# Patient Record
Sex: Male | Born: 2000 | Hispanic: Yes | Marital: Single | Smoking: Never smoker
Health system: Southern US, Community
[De-identification: ages and names within clinical notes are randomized; demographics above are authoritative.]

---
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
## 2018-12-31 ENCOUNTER — Other Ambulatory Visit: Payer: Self-pay

## 2018-12-31 ENCOUNTER — Encounter: Payer: Self-pay | Admitting: Emergency Medicine

## 2018-12-31 DIAGNOSIS — R1030 Lower abdominal pain, unspecified: Secondary | ICD-10-CM | POA: Insufficient documentation

## 2018-12-31 NOTE — ED Triage Notes (Signed)
Patient ambulatory to triage with steady gait, without difficulty or distress noted; pt reports last few days having intermittent nosebleed; also lower abd pain with no accomp symptoms

## 2019-01-01 ENCOUNTER — Emergency Department: Payer: Self-pay

## 2019-01-01 ENCOUNTER — Emergency Department
Admission: EM | Admit: 2019-01-01 | Discharge: 2019-01-01 | Disposition: A | Discharge status: Home / Self Care | Payer: Self-pay | Attending: Emergency Medicine | Admitting: Emergency Medicine

## 2019-01-01 DIAGNOSIS — R1030 Lower abdominal pain, unspecified: Secondary | ICD-10-CM

## 2019-01-01 LAB — COMPREHENSIVE METABOLIC PANEL
ALT: 10 U/L (ref 0–44)
AST: 16 U/L (ref 15–41)
Albumin: 4.6 g/dL (ref 3.5–5.0)
Alkaline Phosphatase: 60 U/L (ref 38–126)
Anion gap: 7 (ref 5–15)
BUN: 11 mg/dL (ref 6–20)
CHLORIDE: 106 mmol/L (ref 98–111)
CO2: 26 mmol/L (ref 22–32)
CREATININE: 1.05 mg/dL (ref 0.61–1.24)
Calcium: 8.9 mg/dL (ref 8.9–10.3)
GFR calc non Af Amer: >60 mL/min (ref >60)
Glucose, Bld: 93 mg/dL (ref 70–99)
Potassium: 3.7 mmol/L (ref 3.5–5.1)
Sodium: 139 mmol/L (ref 135–145)
Total Bilirubin: 0.4 mg/dL (ref 0.3–1.2)
Total Protein: 7.8 g/dL (ref 6.5–8.1)

## 2019-01-01 LAB — URINALYSIS, COMPLETE (UACMP) WITH MICROSCOPIC
BILIRUBIN URINE: NEGATIVE
Bacteria, UA: NONE SEEN
GLUCOSE, UA: NEGATIVE mg/dL
Hgb urine dipstick: NEGATIVE
KETONES UR: NEGATIVE mg/dL
Leukocytes,Ua: NEGATIVE
Nitrite: NEGATIVE
PROTEIN: NEGATIVE mg/dL
Specific Gravity, Urine: 1.018 (ref 1.005–1.030)
Squamous Epithelial / LPF: NONE SEEN (ref 0–5)
pH: 5 (ref 5.0–8.0)

## 2019-01-01 LAB — CBC WITH DIFFERENTIAL/PLATELET
ABS IMMATURE GRANULOCYTES: 0.01 10*3/uL (ref 0.00–0.07)
BASOS PCT: 0 %
Basophils Absolute: 0 10*3/uL (ref 0.0–0.1)
Eosinophils Absolute: 0 10*3/uL (ref 0.0–0.5)
Eosinophils Relative: 0 %
HCT: 39.2 % (ref 39.0–52.0)
Hemoglobin: 13.5 g/dL (ref 13.0–17.0)
IMMATURE GRANULOCYTES: 0 %
Lymphocytes Relative: 34 %
Lymphs Abs: 2.8 10*3/uL (ref 0.7–4.0)
MCH: 29.9 pg (ref 26.0–34.0)
MCHC: 34.4 g/dL (ref 30.0–36.0)
MCV: 86.7 fL (ref 80.0–100.0)
MONOS PCT: 6 %
Monocytes Absolute: 0.5 10*3/uL (ref 0.1–1.0)
NEUTROS ABS: 4.9 10*3/uL (ref 1.7–7.7)
NEUTROS PCT: 60 %
Platelets: 268 10*3/uL (ref 150–400)
RBC: 4.52 MIL/uL (ref 4.22–5.81)
RDW: 11.3 % — ABNORMAL LOW (ref 11.5–15.5)
WBC: 8.2 10*3/uL (ref 4.0–10.5)
nRBC: 0 % (ref 0.0–0.2)

## 2019-01-01 LAB — LIPASE, BLOOD: Lipase: 32 U/L (ref 11–51)

## 2019-01-01 NOTE — ED Provider Notes (Signed)
[facility] Emergency Department Provider Note    First MD Initiated Contact with Patient 01/01/19 0405     (approximate)  I have reviewed the triage vital signs and the nursing notes.   HISTORY  Chief Complaint Epistaxis and Abdominal Pain   HPI Garrett Jackson is a 18 y.o. male presents to the emergency department with bilateral groin pain which patient states has been occurring for months.  Patient denies any dysuria no hematuria.  Patient denies any nausea vomiting or diarrhea.  Patient denies any constipation.  Patient denies any fever.        History reviewed. No pertinent past medical history.  There are no active problems to display for this patient.   History reviewed. No pertinent surgical history.  Prior to Admission medications   Not on File    Allergies Patient has no known allergies.  No family history on file.  Social History Social History   Tobacco Use  . Smoking status: Never Smoker  . Smokeless tobacco: Never Used  Substance Use Topics  . Alcohol use: No  . Drug use: Not on file    Review of Systems Constitutional: No fever/chills Eyes: No visual changes. ENT: No sore throat. Cardiovascular: Denies chest pain. Respiratory: Denies shortness of breath. Gastrointestinal: Positive for bilateral groin pain..  No nausea, no vomiting.  No diarrhea.  No constipation. Genitourinary: Negative for dysuria. Musculoskeletal: Negative for neck pain.  Negative for back pain. Integumentary: Negative for rash. Neurological: Negative for headaches, focal weakness or numbness.   ____________________________________________   PHYSICAL EXAM:  VITAL SIGNS: ED Triage Vitals  Enc Vitals Group     BP 12/31/18 2336 120/61     Pulse Rate 12/31/18 2336 87     Resp 12/31/18 2336 14     Temp 12/31/18 2336 98.3 F (36.8 C)     Temp Source 12/31/18 2336 Oral     SpO2 12/31/18 2336 97 %     Weight 12/31/18 2333 56.7 kg  (125 lb)     Height 12/31/18 2333 1.778 m (5\' 10" )     Head Circumference --      Peak Flow --      Pain Score 12/31/18 2333 7     Pain Loc --      Pain Edu? --      Excl. in GC? --     Constitutional: Alert and oriented. Well appearing and in no acute distress. Eyes: Conjunctivae are normal. . Mouth/Throat: Mucous membranes are moist. Oropharynx non-erythematous. Neck: No stridor.  Cardiovascular: Normal rate, regular rhythm. Good peripheral circulation. Grossly normal heart sounds. Respiratory: Normal respiratory effort.  No retractions. Lungs CTAB. Gastrointestinal: Soft and nontender. No distention.  Musculoskeletal: No lower extremity tenderness nor edema. No gross deformities of extremities. Neurologic:  Normal speech and language. No gross focal neurologic deficits are appreciated.  Skin:  Skin is warm, dry and intact. No rash noted. Psychiatric: Mood and affect are normal. Speech and behavior are normal.  ____________________________________________   LABS (all labs ordered are listed, but only abnormal results are displayed)  Labs Reviewed  CBC WITH DIFFERENTIAL/PLATELET - Abnormal; Notable for the following components:      Result Value   RDW 11.3 (*)    All other components within normal limits  URINALYSIS, COMPLETE (UACMP) WITH MICROSCOPIC - Abnormal; Notable for the following components:   Color, Urine YELLOW (*)    APPearance CLEAR (*)    All other components within normal limits  COMPREHENSIVE METABOLIC PANEL  LIPASE, BLOOD   ____________________________________  RADIOLOGY I, [place] N , personally viewed and evaluated these images (plain radiographs) as part of my medical decision making, as well as reviewing the written report by the radiologist.  ED MD interpretation: Negative exam CT renal study.  Official radiology report(s): Ct Renal Stone Study  Result Date: 01/01/2019 CLINICAL DATA:  Thigh or groin pain.  Bladder pain when laying down.  EXAM: CT ABDOMEN AND PELVIS WITHOUT CONTRAST TECHNIQUE: Multidetector CT imaging of the abdomen and pelvis was performed following the standard protocol without IV contrast. COMPARISON:  None. FINDINGS: Lower chest:  No contributory findings. Hepatobiliary: No focal liver abnormality.No evidence of biliary obstruction or stone. Pancreas: Unremarkable. Spleen: Unremarkable. Adrenals/Urinary Tract: Negative adrenals. No hydronephrosis or stone. Unremarkable bladder. Stomach/Bowel:  No obstruction. No evident bowel inflammation. Vascular/Lymphatic: No acute vascular abnormality. No mass or adenopathy. Reproductive:Negative Other: No ascites or pneumoperitoneum. Musculoskeletal: Negative IMPRESSION: Negative exam. Electronically Signed   By: Marnee Spring M.D.   On: 01/01/2019 04:47    ____________________________________________   Procedures   ____________________________________________   INITIAL IMPRESSION / MDM / ASSESSMENT AND PLAN / ED COURSE  As part of my medical decision making, I reviewed the following data within the electronic MEDICAL RECORD NUMBER   18 year old male presenting with above-stated history and physical exam secondary to bilateral groin pain raising concern for possible inguinal hernia versus UTI however patient has no urinary symptoms.  Also considered possibly STI again without any genital or urinary symptoms.  CT renal study revealed no acute abnormality laboratory data unremarkable. ____________________________________________  FINAL CLINICAL IMPRESSION(S) / ED DIAGNOSES  Final diagnoses:  Inguinal pain, unspecified laterality     MEDICATIONS GIVEN DURING THIS VISIT:  Medications - No data to display   ED Discharge Orders    None       Note:  This document was prepared using Dragon voice recognition software and may include unintentional dictation errors.   Darci Current, MD 01/01/19 2224

## 2019-01-01 NOTE — ED Notes (Signed)
Pt states he has pain in his sides when he lays down and pain in his bladder. Denies pain with urination. Symptoms have been going on for weeks. Pt is no acute distress. Pt reports a nose bleed last night. It stopped on it's own.

## 2020-05-17 IMAGING — CT CT RENAL STONE PROTOCOL
3 of 4 series · 8 of 46 positions shown, 15 images · non-contrast
Comparison: None.

CLINICAL DATA: Thigh or groin pain.  Bladder pain when laying down.

EXAM:
CT ABDOMEN AND PELVIS WITHOUT CONTRAST
TECHNIQUE: Multidetector CT imaging of the abdomen and pelvis was performed
following the standard protocol without IV contrast.

[Series 4: lung bases · axial · 0.60mm/px · z∈[-604,-544]mm · 4 of 22 slices shown, 9 images]
[im 5/22  soft-tissue]
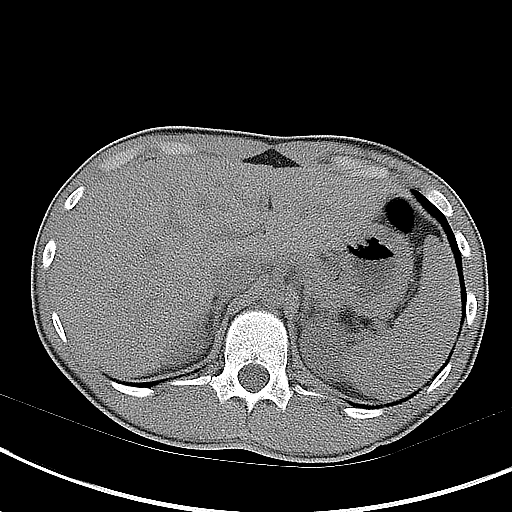
[im 5/22  lung]
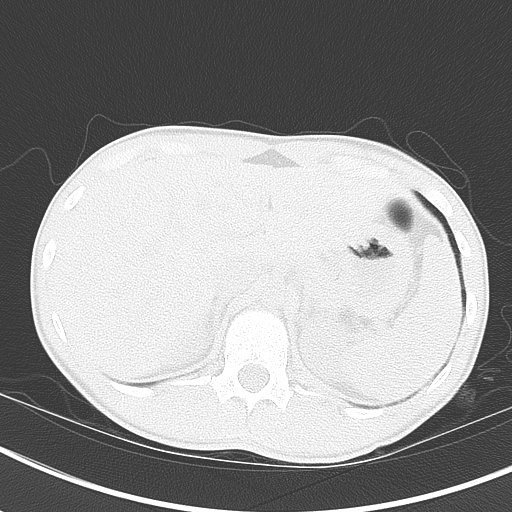
[im 5/22  bone]
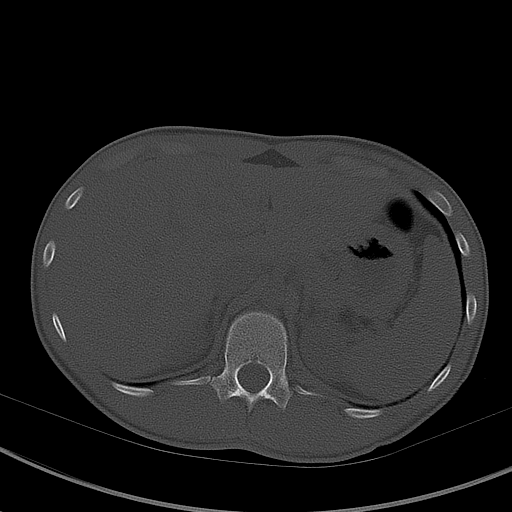
[im 9/22  soft-tissue]
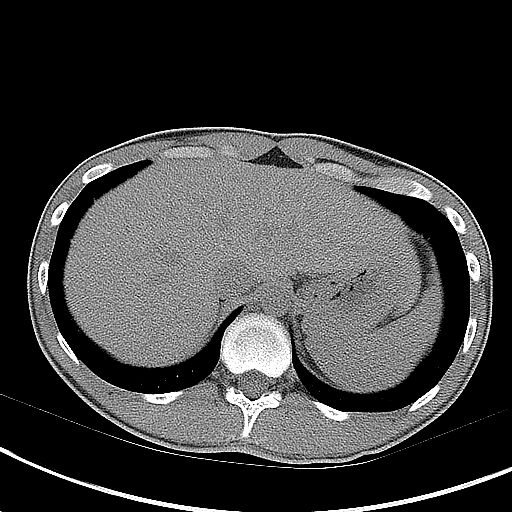
[im 9/22  lung]
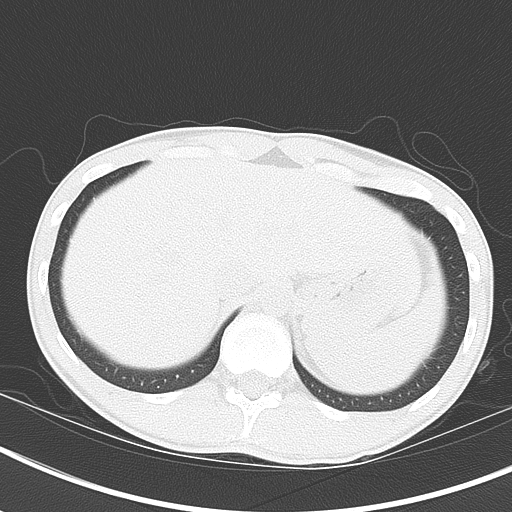
[im 13/22  soft-tissue]
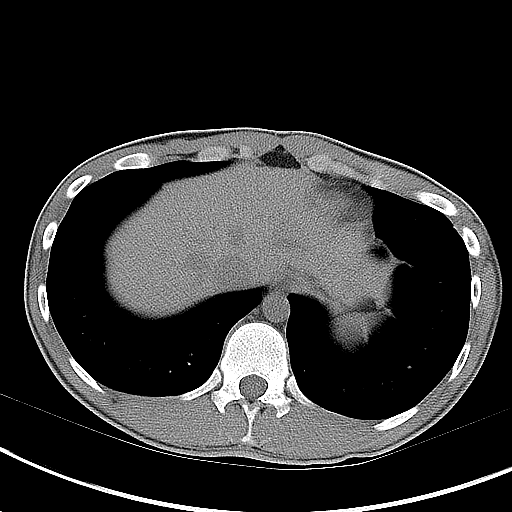
[im 13/22  lung]
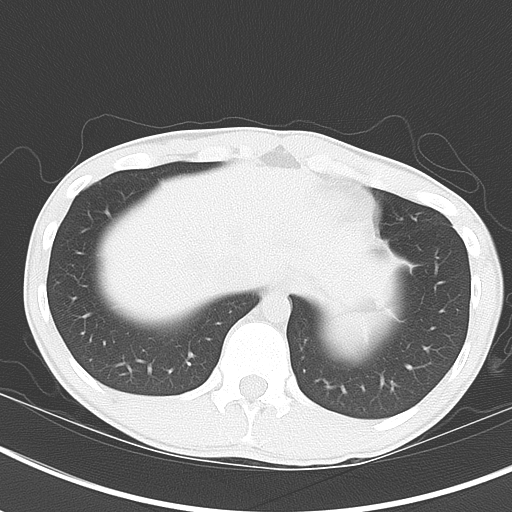
[im 17/22  soft-tissue]
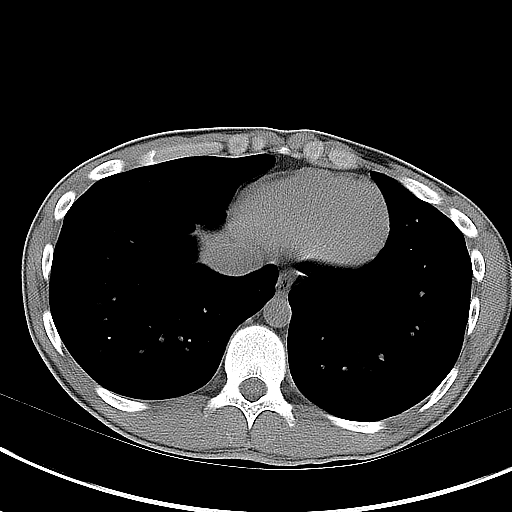
[im 17/22  lung]
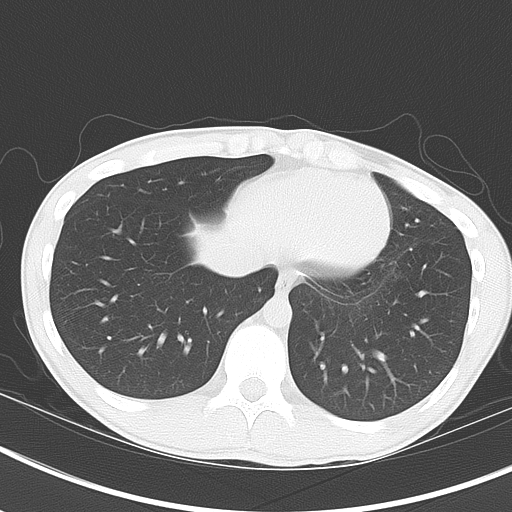

[Series 5: coronal · coronal · 0.64mm/px · 3 of 89 slices shown, 4 images]
[im 30/89  soft-tissue]
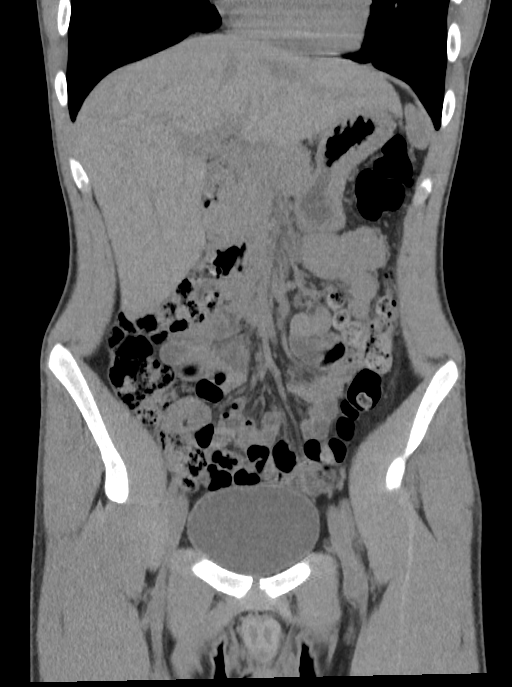
[im 40/89  soft-tissue]
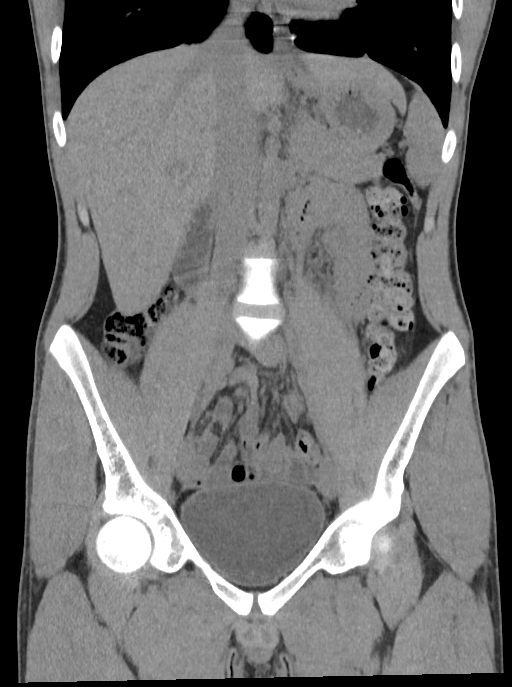
[im 40/89  bone]
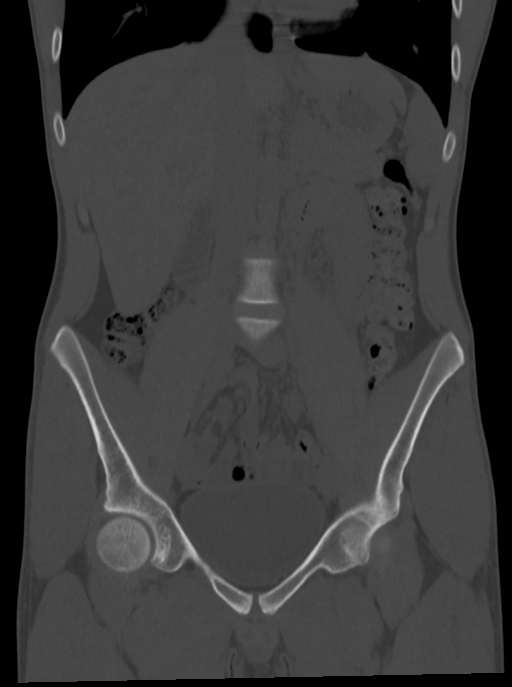
[im 49/89  soft-tissue]
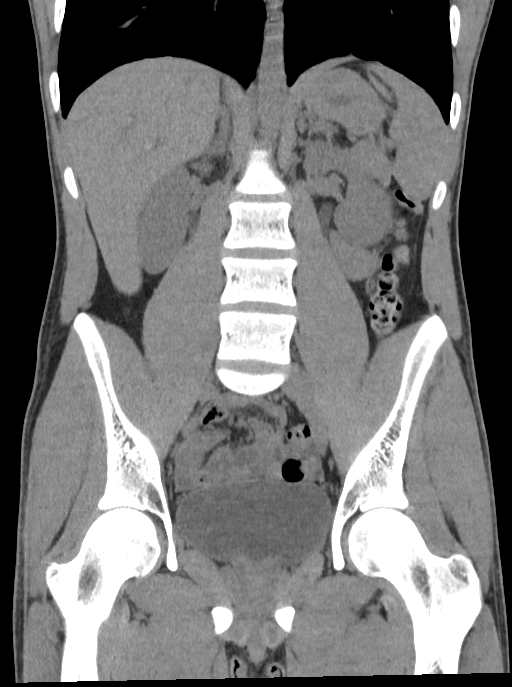

[Series 6: sagittal · sagittal · 0.35mm/px · 1 of 159 slices shown, 2 images]
[im 53/159  soft-tissue]
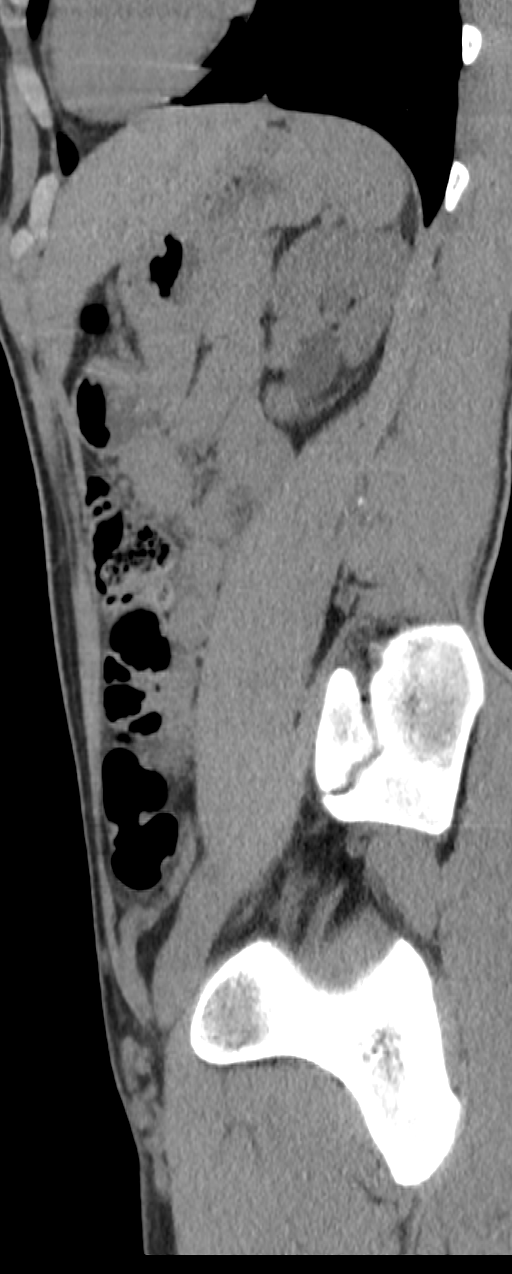
[im 53/159  bone]
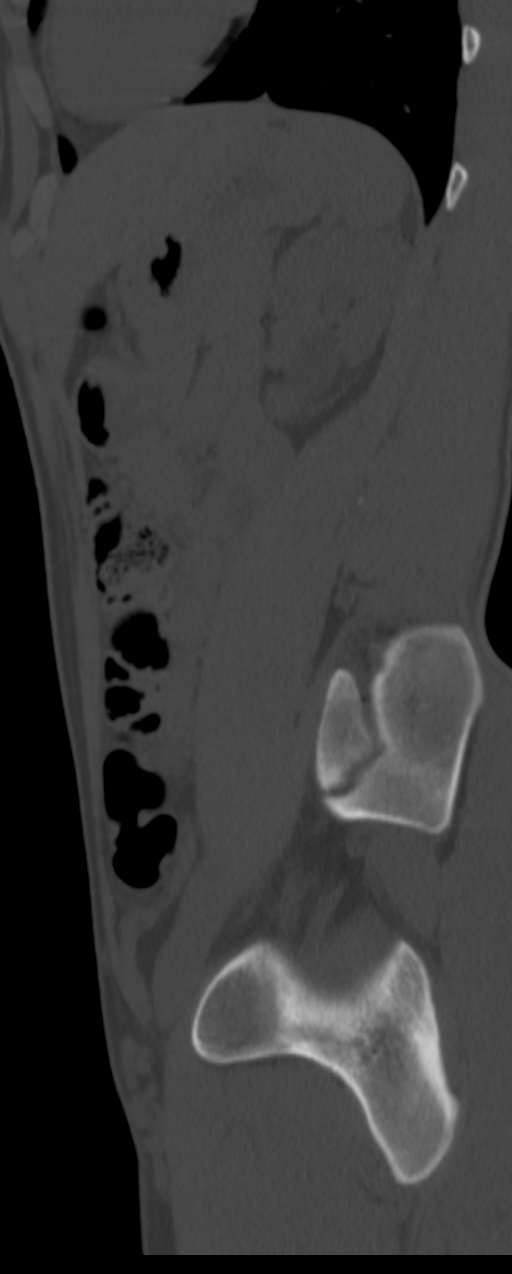

[8 of 46 positions shown; findings below may reference images not displayed]

FINDINGS: Lower chest:  No contributory findings.

Hepatobiliary: No focal liver abnormality.No evidence of biliary
obstruction or stone.

Pancreas: Unremarkable.

Spleen: Unremarkable.

Adrenals/Urinary Tract: Negative adrenals. No hydronephrosis or
stone. Unremarkable bladder.

Stomach/Bowel:  No obstruction. No evident bowel inflammation.

Vascular/Lymphatic: No acute vascular abnormality. No mass or
adenopathy.

Reproductive:Negative

Other: No ascites or pneumoperitoneum.

Musculoskeletal: Negative
IMPRESSION: Negative exam.

## 2022-08-04 ENCOUNTER — Encounter: Payer: Self-pay | Admitting: *Deleted

## 2022-08-04 ENCOUNTER — Other Ambulatory Visit: Payer: Self-pay

## 2022-08-04 DIAGNOSIS — R002 Palpitations: Secondary | ICD-10-CM | POA: Insufficient documentation

## 2022-08-04 DIAGNOSIS — F419 Anxiety disorder, unspecified: Secondary | ICD-10-CM | POA: Insufficient documentation

## 2022-08-04 DIAGNOSIS — R42 Dizziness and giddiness: Secondary | ICD-10-CM | POA: Insufficient documentation

## 2022-08-04 DIAGNOSIS — R202 Paresthesia of skin: Secondary | ICD-10-CM | POA: Insufficient documentation

## 2022-08-04 LAB — BASIC METABOLIC PANEL
Anion gap: 9 (ref 5–15)
BUN: 10 mg/dL (ref 6–20)
CO2: 25 mmol/L (ref 22–32)
Calcium: 9 mg/dL (ref 8.9–10.3)
Chloride: 101 mmol/L (ref 98–111)
Creatinine, Ser: 1.11 mg/dL (ref 0.61–1.24)
GFR, Estimated: >60 mL/min (ref >60)
Glucose, Bld: 88 mg/dL (ref 70–99)
Potassium: 3.5 mmol/L (ref 3.5–5.1)
Sodium: 135 mmol/L (ref 135–145)

## 2022-08-04 LAB — CBC
HCT: 44 % (ref 39.0–52.0)
Hemoglobin: 14.8 g/dL (ref 13.0–17.0)
MCH: 30.3 pg (ref 26.0–34.0)
MCHC: 33.6 g/dL (ref 30.0–36.0)
MCV: 90.2 fL (ref 80.0–100.0)
Platelets: 267 10*3/uL (ref 150–400)
RBC: 4.88 MIL/uL (ref 4.22–5.81)
RDW: 11.6 % (ref 11.5–15.5)
WBC: 6.5 10*3/uL (ref 4.0–10.5)
nRBC: 0 % (ref 0.0–0.2)

## 2022-08-04 LAB — TROPONIN I (HIGH SENSITIVITY): Troponin I (High Sensitivity): 3 ng/L (ref <18)

## 2022-08-04 NOTE — ED Triage Notes (Signed)
Pt was at work tonight and developed a headache, chest pain and hand numbness.  Pt states he felt anxious.  No sx now.  Pt alert  speech clear.

## 2022-08-05 ENCOUNTER — Emergency Department
Admission: EM | Admit: 2022-08-05 | Discharge: 2022-08-05 | Disposition: A | Discharge status: Home / Self Care | Payer: Self-pay | Attending: Emergency Medicine | Admitting: Emergency Medicine

## 2022-08-05 DIAGNOSIS — F419 Anxiety disorder, unspecified: Secondary | ICD-10-CM

## 2022-08-05 DIAGNOSIS — R002 Palpitations: Secondary | ICD-10-CM

## 2022-08-05 NOTE — ED Notes (Signed)
E-signature pad unavailable - Pt verbalized understanding of D/C information - no additional concerns at this time.  

## 2022-08-05 NOTE — ED Provider Notes (Signed)
[facility] Provider Note    Event Date/Time   First MD Initiated Contact with Patient 08/05/22 [phone]     (approximate)   History   Anxiety   HPI  Garrett Jackson is a 21 y.o. male who presents to the ED for evaluation of Anxiety   Patient presents for evaluation of palpitations, paresthesias, dizziness and anxiety.  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  Symptoms were intermittent for a few hours, lasting a matter of seconds-minutes before self resolving.  No syncope, falls or injuries.  Reports feeling better now.  Has not had this sensation before.   Physical Exam   Triage Vital Signs: ED Triage Vitals  Enc Vitals Group     BP 08/04/22 2153 123/67     Pulse Rate 08/04/22 2153 77     Resp 08/04/22 2153 18     Temp 08/04/22 2153 99.5 F (37.5 C)     Temp Source 08/04/22 2153 Oral     SpO2 08/04/22 2153 95 %     Weight 08/04/22 2154 135 lb (61.2 kg)     Height 08/04/22 2154 5\' 10"  (1.778 m)     Head Circumference --      Peak Flow --      Pain Score 08/04/22 2154 0     Pain Loc --      Pain Edu? --      Excl. in GC? --     Most recent vital signs: Vitals:   08/04/22 2153  BP: 123/67  Pulse: 77  Resp: 18  Temp: 99.5 F (37.5 C)  SpO2: 95%    General: Awake, no distress.  Ambulatory with normal gait, looks well CV:  Good peripheral perfusion.  RRR without appreciable murmur Resp:  Normal effort.  Abd:  No distention.  MSK:  No deformity noted.  Neuro:  No focal deficits appreciated. Cranial nerves II through XII intact 5/5 strength and sensation in all 4 extremities Other:     ED Results / Procedures / Treatments   Labs (all labs ordered are listed, but only abnormal results are displayed) Labs Reviewed  BASIC METABOLIC PANEL  CBC  TROPONIN I (HIGH SENSITIVITY)  TROPONIN I (HIGH SENSITIVITY)    EKG Sinus rhythm with a rate of  78 bpm.  Normal axis and intervals.  Benign early repolarization.  No clear signs of acute ischemia.  RADIOLOGY   Official radiology report(s): No results found.  PROCEDURES and INTERVENTIONS:  Procedures  Medications - No data to display   IMPRESSION / MDM / ASSESSMENT AND PLAN / ED COURSE  I reviewed the triage vital signs and the nursing notes.  Differential diagnosis includes, but is not limited to, PE, pneumothorax, anxiety, GERD, cardiac dysrhythmia  {Patient presents with symptoms of an acute illness or injury that is potentially life-threatening.  21 year old presents with palpitations and dizziness, possibly related to anxiety and ultimately suitable for outpatient management.  Looks systemically well and has no symptoms here in the ED.  Has benign work-up with normal EKG, normal metabolic panel, CBC and troponin.  We will discharge with return precautions.      FINAL CLINICAL IMPRESSION(S) / ED DIAGNOSES   Final diagnoses:  None     Rx / DC Orders   ED Discharge Orders     None        Note:  This document was prepared using Dragon  voice recognition software and may include unintentional dictation errors.   Vladimir Crofts, MD 08/05/22 [phone]
